# Patient Record
Sex: Female | Born: 1945 | Race: White | Hispanic: No | Smoking: Current every day smoker
Health system: Southern US, Community
[De-identification: ages and names within clinical notes are randomized; demographics above are authoritative.]

## PROBLEM LIST (undated history)

## (undated) DIAGNOSIS — F329 Major depressive disorder, single episode, unspecified: Secondary | ICD-10-CM

## (undated) DIAGNOSIS — G620 Drug-induced polyneuropathy: Secondary | ICD-10-CM

## (undated) DIAGNOSIS — C3490 Malignant neoplasm of unspecified part of unspecified bronchus or lung: Secondary | ICD-10-CM

## (undated) DIAGNOSIS — F17200 Nicotine dependence, unspecified, uncomplicated: Secondary | ICD-10-CM

## (undated) DIAGNOSIS — F32A Depression, unspecified: Secondary | ICD-10-CM

## (undated) DIAGNOSIS — M810 Age-related osteoporosis without current pathological fracture: Secondary | ICD-10-CM

## (undated) DIAGNOSIS — K224 Dyskinesia of esophagus: Secondary | ICD-10-CM

## (undated) DIAGNOSIS — T451X5A Adverse effect of antineoplastic and immunosuppressive drugs, initial encounter: Secondary | ICD-10-CM

## (undated) HISTORY — DX: Dyskinesia of esophagus: K22.4

## (undated) HISTORY — PX: HERNIA REPAIR: SHX51

## (undated) HISTORY — PX: CATARACT EXTRACTION, BILATERAL: SHX1313

## (undated) HISTORY — DX: Drug-induced polyneuropathy: G62.0

## (undated) HISTORY — DX: Depression, unspecified: F32.A

## (undated) HISTORY — PX: TOTAL ABDOMINAL HYSTERECTOMY: SHX209

## (undated) HISTORY — DX: Nicotine dependence, unspecified, uncomplicated: F17.200

## (undated) HISTORY — DX: Adverse effect of antineoplastic and immunosuppressive drugs, initial encounter: T45.1X5A

## (undated) HISTORY — DX: Age-related osteoporosis without current pathological fracture: M81.0

## (undated) HISTORY — DX: Major depressive disorder, single episode, unspecified: F32.9

---

## 2005-01-31 HISTORY — PX: LUNG REMOVAL, PARTIAL: SHX233

## 2012-05-13 ENCOUNTER — Emergency Department (HOSPITAL_COMMUNITY)
Admission: EM | Admit: 2012-05-13 | Discharge: 2012-05-13 | Disposition: A | Discharge status: Home / Self Care | Payer: Medicare Other | Attending: Emergency Medicine | Admitting: Emergency Medicine

## 2012-05-13 ENCOUNTER — Encounter (HOSPITAL_COMMUNITY): Payer: Self-pay | Admitting: *Deleted

## 2012-05-13 DIAGNOSIS — Z85118 Personal history of other malignant neoplasm of bronchus and lung: Secondary | ICD-10-CM | POA: Insufficient documentation

## 2012-05-13 DIAGNOSIS — F17200 Nicotine dependence, unspecified, uncomplicated: Secondary | ICD-10-CM | POA: Insufficient documentation

## 2012-05-13 DIAGNOSIS — Z7982 Long term (current) use of aspirin: Secondary | ICD-10-CM | POA: Insufficient documentation

## 2012-05-13 DIAGNOSIS — Z760 Encounter for issue of repeat prescription: Secondary | ICD-10-CM | POA: Insufficient documentation

## 2012-05-13 DIAGNOSIS — Z79899 Other long term (current) drug therapy: Secondary | ICD-10-CM | POA: Insufficient documentation

## 2012-05-13 HISTORY — DX: Malignant neoplasm of unspecified part of unspecified bronchus or lung: C34.90

## 2012-05-13 MED ORDER — TRAMADOL HCL 50 MG PO TABS
50.0000 mg | ORAL_TABLET | Freq: Once | ORAL | Status: AC
  Administered 2012-05-13: 50 mg via ORAL
  Filled 2012-05-13: qty 1

## 2012-05-13 NOTE — ED Notes (Signed)
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  States she has recently moved here and accidentally threw out her rx for Methadone.

## 2012-05-13 NOTE — ED Provider Notes (Signed)
History    This chart was scribed for non-physician practitioner working with Shelda Jakes, MD by Frederik Pear, ED Scribe. This patient was seen in room TR04C/TR04C and the patient's care was started at 2224.   CSN: [id number]  Arrival date & time 05/13/12  2047   First MD Initiated Contact with Patient 05/13/12 2224      Chief Complaint  Patient presents with  . Medication Refill    (Consider location/radiation/quality/duration/timing/severity/associated sxs/prior treatment) The history is provided by the patient and medical records. No language interpreter was used.    Krystal Stephens is a 66 y.o. female who presents to the Emergency Department with a chief complaint of a medication refill. She reports that she has a h/o of lung cancer and takes 10 mg of daily methadone by [facility]. She states that she threw accidentally threw away her prescription and is out of her medication. She states that she has been taking extra strength Excedrin without relief.   Past Medical History  Diagnosis Date  . Lung cancer     stage 4, 6 years ago, pt survived    History reviewed. No pertinent past surgical history.  History reviewed. No pertinent family history.  History  Substance Use Topics  . Smoking status: Current Every Day Smoker -- 0.10 packs/day  . Smokeless tobacco: Not on file  . Alcohol Use: Yes    OB History   Grav Para Term Preterm Abortions TAB SAB Ect Mult Living                  Review of Systems A complete 10 system review of systems was obtained and all systems are negative except as noted in the HPI and PMH.   Allergies  Anesthetics, amide  Home Medications   Current Outpatient Rx  Name  Route  Sig  Dispense  Refill  . aspirin 81 MG tablet   Oral   Take 81 mg by mouth daily.         Marland Kitchen buPROPion (WELLBUTRIN SR) 150 MG 12 hr tablet   Oral   Take 150 mg by mouth 2 (two) times daily.         . cholecalciferol (VITAMIN D) 1000 UNITS tablet   Oral   Take 3,000 Units by mouth daily.         Marland Kitchen gabapentin (NEURONTIN) 800 MG tablet   Oral   Take 1,600 mg by mouth 2 (two) times daily.         Marland Kitchen loratadine (CLARITIN) 10 MG tablet   Oral   Take 10 mg by mouth daily.         . methadone (DOLOPHINE) 10 MG tablet   Oral   Take 10 mg by mouth 4 (four) times daily.         . Multiple Vitamin (MULTIVITAMIN WITH MINERALS) TABS   Oral   Take 1 tablet by mouth daily.           BP 163/102  Pulse 80  Temp(Src) 98.1 F (36.7 C) (Oral)  Resp 18  SpO2 100%  Physical Exam  Nursing note and vitals reviewed. Constitutional: She is oriented to person, place, and time. She appears well-developed and well-nourished. No distress.  HENT:  Head: Normocephalic and atraumatic.  Eyes: EOM are normal. Pupils are equal, round, and reactive to light.  Neck: Normal range of motion. Neck supple. No tracheal deviation present.  Cardiovascular: Normal rate.   Pulmonary/Chest: Effort normal. No respiratory distress.  Abdominal: Soft. She exhibits no distension.  Musculoskeletal: Normal range of motion. She exhibits no edema.  Neurological: She is alert and oriented to person, place, and time.  Skin: Skin is warm and dry.  Psychiatric: She has a normal mood and affect. Her behavior is normal.    ED Course  Procedures (including critical care time)  DIAGNOSTIC STUDIES: Oxygen Saturation is 100% on room air, normal by my interpretation.    COORDINATION OF CARE:  22:38- Discussed planned course of treatment with the patient, including Tramadol, who is agreeable at this time.  Labs Reviewed - No data to display No results found.   1. Medication refill       MDM  Patient "threw away her methadone rx."  Informed her of our pain med refill policy.  Patient drove herself her.  Will give her some tramadol, and will have her f/u with her pain clinic tomorrow.  I personally performed the services described in this documentation,  which was scribed in my presence. The recorded information has been reviewed and is accurate.         Roxy Horseman, PA-C 05/14/12 [phone]

## 2012-05-13 NOTE — ED Notes (Signed)
Pt reports she accidentally threw out her prescription for methadone and c/o bilateral hand and foot pain. Pt reports she started taking methadone last year for neuropathy, takes it 4 times a day. Pt reports last dose was this morning and has been supplementing with extra strength Excedrin and hasn't gotten any relief. Pt reports she was given the prescription by [facility]. Pt in nad, skin warm and dry, resp e/u.

## 2012-05-15 NOTE — ED Provider Notes (Signed)
Medical screening examination/treatment/procedure(s) were performed by non-physician practitioner and as supervising physician I was immediately available for consultation/collaboration.    Shakim Faith W. Samina Weekes, MD 05/15/12 1325 

## 2013-03-04 ENCOUNTER — Other Ambulatory Visit: Payer: Self-pay | Admitting: Family Medicine

## 2013-03-04 DIAGNOSIS — R1310 Dysphagia, unspecified: Secondary | ICD-10-CM

## 2013-03-08 ENCOUNTER — Ambulatory Visit
Admission: RE | Admit: 2013-03-08 | Discharge: 2013-03-08 | Disposition: A | Discharge status: Home / Self Care | Payer: Medicare Other | Source: Ambulatory Visit | Attending: Family Medicine | Admitting: Family Medicine

## 2013-03-08 ENCOUNTER — Other Ambulatory Visit: Payer: Self-pay | Admitting: Family Medicine

## 2013-03-08 DIAGNOSIS — R1310 Dysphagia, unspecified: Secondary | ICD-10-CM

## 2013-04-09 ENCOUNTER — Ambulatory Visit: Payer: Medicare Other | Attending: Family Medicine

## 2013-04-10 ENCOUNTER — Other Ambulatory Visit (HOSPITAL_COMMUNITY): Payer: Self-pay | Admitting: Family Medicine

## 2013-04-10 DIAGNOSIS — R1310 Dysphagia, unspecified: Secondary | ICD-10-CM

## 2013-04-16 ENCOUNTER — Ambulatory Visit (HOSPITAL_COMMUNITY): Admission: RE | Admit: 2013-04-16 | Payer: Medicare Other | Source: Ambulatory Visit

## 2013-04-16 ENCOUNTER — Inpatient Hospital Stay (HOSPITAL_COMMUNITY): Admission: RE | Admit: 2013-04-16 | Payer: Medicare Other | Source: Ambulatory Visit

## 2013-11-04 ENCOUNTER — Ambulatory Visit (INDEPENDENT_AMBULATORY_CARE_PROVIDER_SITE_OTHER): Payer: Medicare Other | Admitting: Cardiology

## 2013-11-04 ENCOUNTER — Encounter: Payer: Self-pay | Admitting: Cardiology

## 2013-11-04 VITALS — BP 126/68 | HR 67 | Ht 64.0 in | Wt 134.6 lb

## 2013-11-04 DIAGNOSIS — G620 Drug-induced polyneuropathy: Secondary | ICD-10-CM | POA: Insufficient documentation

## 2013-11-04 DIAGNOSIS — F329 Major depressive disorder, single episode, unspecified: Secondary | ICD-10-CM | POA: Insufficient documentation

## 2013-11-04 DIAGNOSIS — F32A Depression, unspecified: Secondary | ICD-10-CM | POA: Insufficient documentation

## 2013-11-04 DIAGNOSIS — C3490 Malignant neoplasm of unspecified part of unspecified bronchus or lung: Secondary | ICD-10-CM | POA: Insufficient documentation

## 2013-11-04 DIAGNOSIS — T451X5A Adverse effect of antineoplastic and immunosuppressive drugs, initial encounter: Secondary | ICD-10-CM

## 2013-11-04 DIAGNOSIS — R079 Chest pain, unspecified: Secondary | ICD-10-CM | POA: Insufficient documentation

## 2013-11-04 DIAGNOSIS — Z720 Tobacco use: Secondary | ICD-10-CM

## 2013-11-04 DIAGNOSIS — F17200 Nicotine dependence, unspecified, uncomplicated: Secondary | ICD-10-CM | POA: Insufficient documentation

## 2013-11-04 DIAGNOSIS — M810 Age-related osteoporosis without current pathological fracture: Secondary | ICD-10-CM | POA: Insufficient documentation

## 2013-11-04 DIAGNOSIS — K224 Dyskinesia of esophagus: Secondary | ICD-10-CM | POA: Insufficient documentation

## 2013-11-04 NOTE — Patient Instructions (Signed)
Your physician has requested that you have an echocardiogram. Echocardiography is a painless test that uses sound waves to create images of your heart. It provides your doctor with information about the size and shape of your heart and how well your heart's chambers and valves are working. This procedure takes approximately one hour. There are no restrictions for this procedure.  Your physician has requested that you have a lexiscan myoview. For further information please visit [email or web address]. Please follow instruction sheet, as given.  Your physician recommends that you schedule a follow-up appointment as needed.

## 2013-11-04 NOTE — Progress Notes (Signed)
[place], [address] [place], [place]  [address] Phone: [phone] Fax:  [phone]  Date:  11/04/2013   ID:  Gertha Lichtenberg, DOB February 22, 1945, MRN [id number]  PCP:  Tawanna Solo, MD  Cardiologist:  Fransico Him, MD    History of Present Illness: Krystal Stephens is a 68 y.o. female with a history of stage 4 lung CA s/p right lobectomy 2007 and chemo with resultant neuropathy and then left lung CA in 2013 with repeat chemo, depression and tobacco use who presents today for evaluation of chest pain.  She says that the chest pain has been occurring for about 4 months and has had it 4 times.  She has a very strong family history of CAD.  She has awakened twice with severe chest burning mid sternal and spread across her chest.  She also noticed that her heart beat was very erratic.  She also had some pain in her left arm.  She took an ASA and it settled down.  It was different than her discomfort she gets with her esophageal dysmotility.  The other 2 episodes occurred during the day while doing housework.  These episodes lasted about 15-20 minutes and resolved when she laid down.  She had similar symptoms with left arm pain and chest burning.  She did get diaphoretic and nauseated.  She occasionally has some DOE.  She denies PND or orthopnea. She denies any LE edema or syncope.     Wt Readings from Last 3 Encounters:  11/04/13 134 lb 9.6 oz (61.054 kg)     Past Medical History  Diagnosis Date  . Lung cancer     stage 4 with right lobectomy 2007 s/p chemo and then found in left lung 2013 s/p chemo  . Peripheral neuropathy due to chemotherapy   . Depression   . Tobacco use disorder   . Osteoporosis   . Esophageal dysmotility     Current Outpatient Prescriptions  Medication Sig Dispense Refill  . alendronate (FOSAMAX) 70 MG tablet Take 70 mg by mouth once a week. Take with a full glass of water on an empty stomach.      Marland Kitchen aspirin 81 MG tablet Take 81 mg by mouth daily.      .  Calcium-Magnesium-Vitamin D (CALCIUM 500 PO) Take by mouth daily.      . cholecalciferol (VITAMIN D) 1000 UNITS tablet Take 3,000 Units by mouth daily.      Marland Kitchen escitalopram (LEXAPRO) 5 mg TABS tablet Take 5 mg by mouth 2 (two) times daily.      Marland Kitchen gabapentin (NEURONTIN) 800 MG tablet Take 800 mg by mouth 3 (three) times daily.       Marland Kitchen HYDROMORPHONE HCL PO Take by mouth as directed.      . loratadine (CLARITIN) 10 MG tablet Take 10 mg by mouth daily.      . methadone (DOLOPHINE) 10 MG tablet Take 10 mg by mouth 4 (four) times daily.      . Multiple Vitamin (MULTIVITAMIN WITH MINERALS) TABS Take 1 tablet by mouth daily.      . SUMAtriptan Succinate (IMITREX PO) Take 100 mg by mouth as needed. FOR MIGRAINES       No current facility-administered medications for this visit.    Allergies:    Allergies  Allergen Reactions  . Anesthetics, Amide     Unable to say what type of anesthetic  . Darvon [Propoxyphene]   . Demerol [Meperidine]   . Pentothal [Thiopental]   .  Shellfish Allergy     Social History:  The patient  reports that she has been smoking.  She does not have any smokeless tobacco history on file. She reports that she drinks alcohol. She reports that she does not use illicit drugs.   Family History:  The patient's family history includes Heart attack in her mother; Heart disease in her brother, father, and mother; Heart failure in her father and mother; Hypertension in her father and mother.   ROS:  Please see the history of present illness.      All other systems reviewed and negative.   PHYSICAL EXAM: VS:  BP 126/68  Pulse 67  Ht 5\' 4"  (1.626 m)  Wt 134 lb 9.6 oz (61.054 kg)  BMI 23.09 kg/m2 Well nourished, well developed, in no acute distress HEENT: normal Neck: no JVD Cardiac:  normal S1, S2; RRR; no murmur Lungs:  clear to auscultation bilaterally, no wheezing, rhonchi or rales Abd: soft, nontender, no hepatomegaly Ext: no edema Skin: warm and dry Neuro:  CNs 2-12  intact, no focal abnormalities noted  EKG:  NSR with no ST changes     ASSESSMENT AND PLAN:  1. Chest pain with normal EKG in a patient with significant cardiac risk factors including strong family history of CAD, history of XRT to chest, ongoing tobacco use. Leane Call (she has a peripheral neuropathy and cannot walk on treadmill) - check 2D echo to evaluate for pericardial effusion 2. Ongoing tobacco use 3. Stage IV lung CA in both lung 4. Chemo induced peripheral neuropathy  Followup PRN pending results of stress test  Signed, Fransico Him, MD [facility] 11/04/2013 9:33 AM

## 2013-11-12 ENCOUNTER — Ambulatory Visit (HOSPITAL_BASED_OUTPATIENT_CLINIC_OR_DEPARTMENT_OTHER): Payer: Medicare Other | Admitting: Radiology

## 2013-11-12 ENCOUNTER — Ambulatory Visit (HOSPITAL_COMMUNITY): Payer: Medicare Other | Attending: Internal Medicine | Admitting: Radiology

## 2013-11-12 VITALS — BP 108/73 | Ht 64.0 in | Wt 131.0 lb

## 2013-11-12 DIAGNOSIS — R079 Chest pain, unspecified: Secondary | ICD-10-CM | POA: Insufficient documentation

## 2013-11-12 DIAGNOSIS — R61 Generalized hyperhidrosis: Secondary | ICD-10-CM | POA: Diagnosis not present

## 2013-11-12 DIAGNOSIS — R002 Palpitations: Secondary | ICD-10-CM | POA: Insufficient documentation

## 2013-11-12 DIAGNOSIS — R0600 Dyspnea, unspecified: Secondary | ICD-10-CM | POA: Diagnosis not present

## 2013-11-12 DIAGNOSIS — R0602 Shortness of breath: Secondary | ICD-10-CM

## 2013-11-12 DIAGNOSIS — F17200 Nicotine dependence, unspecified, uncomplicated: Secondary | ICD-10-CM

## 2013-11-12 DIAGNOSIS — R110 Nausea: Secondary | ICD-10-CM | POA: Insufficient documentation

## 2013-11-12 MED ORDER — TECHNETIUM TC 99M SESTAMIBI GENERIC - CARDIOLITE
33.0000 | Freq: Once | INTRAVENOUS | Status: AC | PRN
  Administered 2013-11-12: 33 via INTRAVENOUS

## 2013-11-12 MED ORDER — REGADENOSON 0.4 MG/5ML IV SOLN
0.4000 mg | Freq: Once | INTRAVENOUS | Status: AC
  Administered 2013-11-12: 0.4 mg via INTRAVENOUS

## 2013-11-12 MED ORDER — AMINOPHYLLINE 25 MG/ML IV SOLN
75.0000 mg | Freq: Once | INTRAVENOUS | Status: AC
  Administered 2013-11-12: 75 mg via INTRAVENOUS

## 2013-11-12 MED ORDER — TECHNETIUM TC 99M SESTAMIBI GENERIC - CARDIOLITE
11.0000 | Freq: Once | INTRAVENOUS | Status: AC | PRN
  Administered 2013-11-12: 11 via INTRAVENOUS

## 2013-11-12 NOTE — Progress Notes (Signed)
Echocardiogram performed.  

## 2013-11-12 NOTE — Progress Notes (Signed)
[facility] [address] [place], [place] [address] [phone]    Cardiology Nuclear Med Study  Krystal Stephens is a 67 y.o. female     MRN : [id number]     DOB: 19-Mar-1945  Procedure Date: 11/12/2013  Nuclear Med Background Indication for Stress Test:  Evaluation for Ischemia History:  Asthma and Stage 4 Lung CA, '13 Chemo '07  (R) Lobectomy Cardiac Risk Factors: none  Symptoms:  Chest Pain, Diaphoresis, DOE, Nausea and Palpitations   Nuclear Pre-Procedure Caffeine/Decaff Intake:  10:00pm NPO After: 10:00pm   Lungs:  clear O2 Sat: 95/96% on room air. IV 0.9% NS with Angio Cath:  22g  IV Site: R Hand  IV Started by:  Matilde Haymaker, RN  Chest Size (in):  34 Cup Size: B  Height: 5\' 4"  (1.626 m)  Weight:  131 lb (59.421 kg)  BMI:  Body mass index is 22.47 kg/(m^2). Tech Comments:  Aminophylline 75 mg IV given for symptoms. All were resolved before leaving.    Nuclear Med Study 1 or 2 day study: 1 day  Stress Test Type:  Lexiscan  Reading MD: n/a  Order Authorizing Provider:  Tressia Miners Turner,MD  Resting Radionuclide: Technetium 78m Sestamibi  Resting Radionuclide Dose: 11.0 mCi   Stress Radionuclide:  Technetium 98m Sestamibi  Stress Radionuclide Dose: 33.0 mCi           Stress Protocol Rest HR: 53 Stress HR: 71  Rest BP: 108/73 Stress BP: 120/86  Exercise Time (min): n/a METS: n/a   Predicted Max HR: 152 bpm % Max HR: 46.71 bpm Rate Pressure Product: 8520   Dose of Adenosine (mg):  n/a Dose of Lexiscan: 0.4 mg  Dose of Atropine (mg): n/a Dose of Dobutamine: n/a mcg/kg/min (at max HR)  Stress Test Technologist: Perrin Maltese, EMT-P  Nuclear Technologist:  Earl Many, CNMT     Rest Procedure:  Myocardial perfusion imaging was performed at rest 45 minutes following the intravenous administration of Technetium 29m Sestamibi. Rest ECG: Sinus bradycardia; no ST changes.  Stress Procedure:  The patient received IV Lexiscan 0.4 mg  over 15-seconds.  Technetium 62m Sestamibi injected at 30-seconds. This patient felt weird and had nausea with the Lexiscan injection. Quantitative spect images were obtained after a 45 minute delay. Stress ECG: No significant ST segment change suggestive of ischemia.  QPS Raw Data Images:  Acquisition technically good; normal left ventricular size. Stress Images:  Normal homogeneous uptake in all areas of the myocardium. Rest Images:  Normal homogeneous uptake in all areas of the myocardium. Subtraction (SDS):  No evidence of ischemia. Transient Ischemic Dilatation (Normal <1.22):  0.96 Lung/Heart Ratio (Normal <0.45):  0.28  Quantitative Gated Spect Images QGS EDV:  63 ml QGS ESV:  13 ml  Impression Exercise Capacity:  Lexiscan with no exercise. BP Response:  Normal blood pressure response. Clinical Symptoms:  No chest pain or dyspnea. ECG Impression:  No significant ST segment change suggestive of ischemia. Comparison with Prior Nuclear Study: No previous nuclear study performed  Overall Impression:  Normal stress nuclear study.  LV Ejection Fraction: 79%.  LV Wall Motion:  NL LV Function; NL Wall Motion  Kirk Ruths

## 2013-11-18 ENCOUNTER — Other Ambulatory Visit: Payer: Self-pay | Admitting: *Deleted

## 2013-11-18 DIAGNOSIS — K219 Gastro-esophageal reflux disease without esophagitis: Secondary | ICD-10-CM

## 2013-11-18 MED ORDER — PANTOPRAZOLE SODIUM 40 MG PO TBEC
40.0000 mg | DELAYED_RELEASE_TABLET | Freq: Every day | ORAL | Status: AC
Start: 2013-11-18 — End: ?

## 2013-12-30 ENCOUNTER — Encounter: Payer: Self-pay | Admitting: *Deleted

## 2014-01-01 ENCOUNTER — Ambulatory Visit: Payer: Medicare Other | Admitting: Cardiology

## 2014-01-29 ENCOUNTER — Encounter: Payer: Self-pay | Admitting: Cardiology

## 2014-06-25 ENCOUNTER — Other Ambulatory Visit: Payer: Self-pay | Admitting: Family Medicine

## 2014-06-25 DIAGNOSIS — R51 Headache: Principal | ICD-10-CM

## 2014-06-25 DIAGNOSIS — R519 Headache, unspecified: Secondary | ICD-10-CM

## 2014-07-05 ENCOUNTER — Ambulatory Visit
Admission: RE | Admit: 2014-07-05 | Discharge: 2014-07-05 | Disposition: A | Discharge status: Home / Self Care | Payer: Medicare Other | Source: Ambulatory Visit | Attending: Family Medicine | Admitting: Family Medicine

## 2014-07-05 DIAGNOSIS — R51 Headache: Principal | ICD-10-CM

## 2014-07-05 DIAGNOSIS — R519 Headache, unspecified: Secondary | ICD-10-CM

## 2014-07-05 MED ORDER — GADOBENATE DIMEGLUMINE 529 MG/ML IV SOLN
13.0000 mL | Freq: Once | INTRAVENOUS | Status: AC | PRN
  Administered 2014-07-05: 13 mL via INTRAVENOUS

## 2015-10-14 IMAGING — RF DG UGI W/O KUB
15 of 18 series · 18 of 24 positions shown · non-contrast
Comparison: None.

FLUOROSCOPY TIME:  1 min 48 seconds

CLINICAL DATA: Dysphagia, history of lung cancer

EXAM:
UPPER GI SERIES WITHOUT KUB
TECHNIQUE: Routine upper GI series was performed with thin barium.

[Series 1: run · 2 of 15 slices shown (1 of 14)]
[im 1/15]
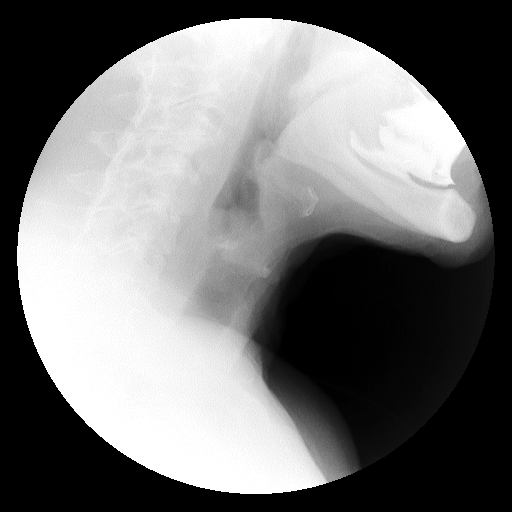
[im 15/15]
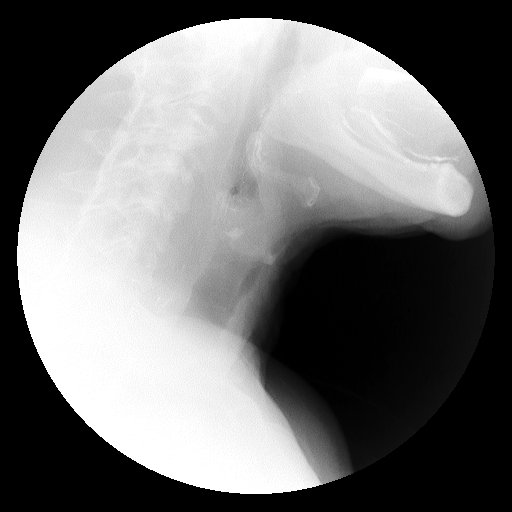

[Series 2: run · 3 of 13 slices shown (2 of 14)]
[im 1/13]
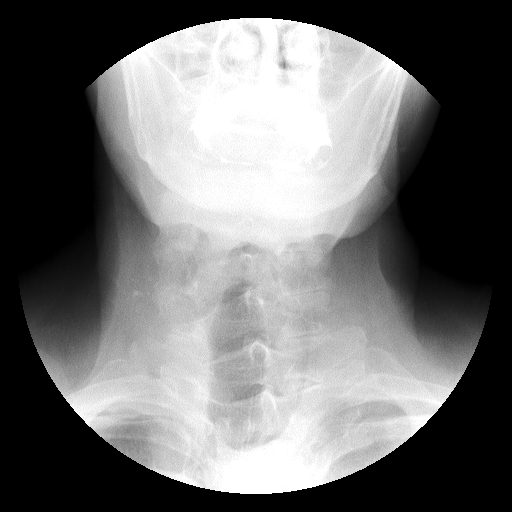
[im 5/13]
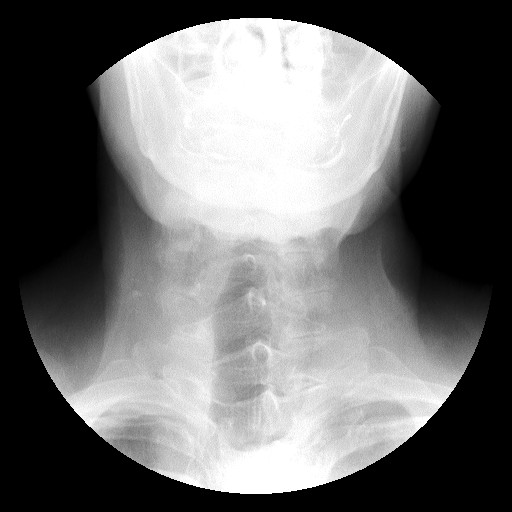
[im 13/13]
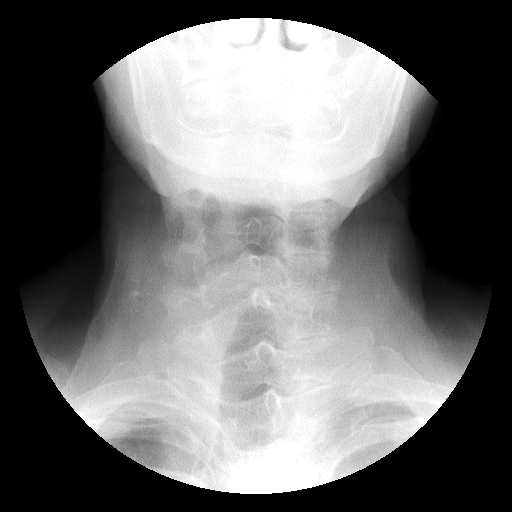

[Series 3: run · 1 of 4 slices shown (3 of 14)]
[im 1/4]
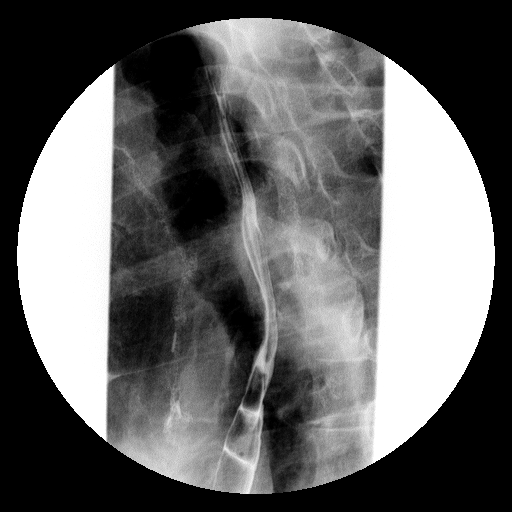

[Series 4: run · 1 of 2 slices shown (4 of 14)]
[im 1/2]
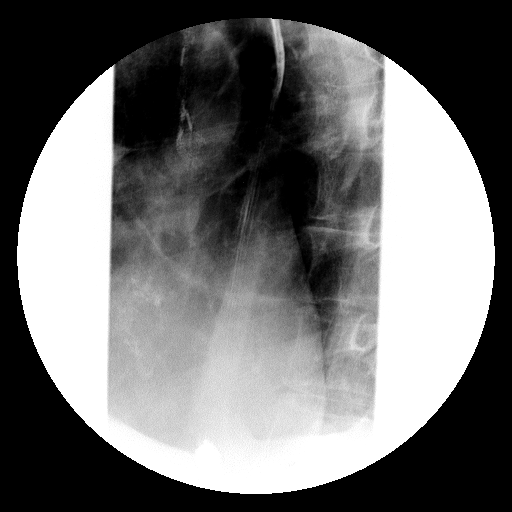

[Series 6: run · 1 of 3 slices shown (5 of 14)]
[im 1/3]
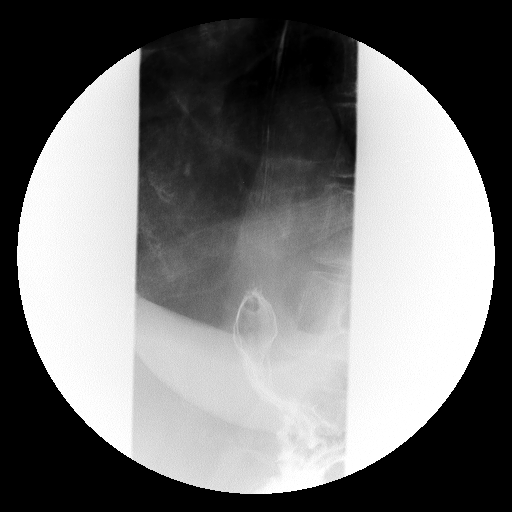

[Series 7: run · 1 of 1 slices shown (6 of 14)]
[im 1/1]
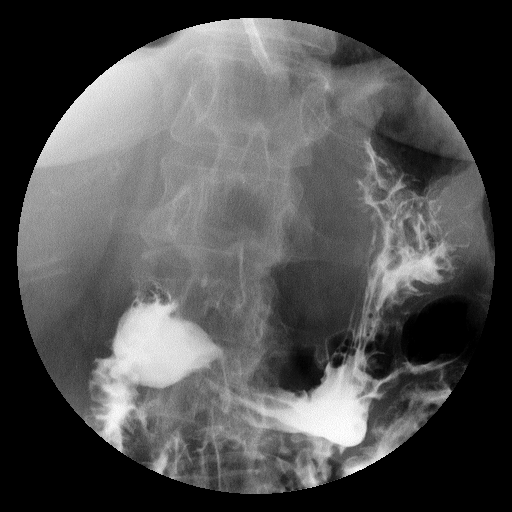

[Series 8: run · 1 of 2 slices shown (7 of 14)]
[im 1/2]
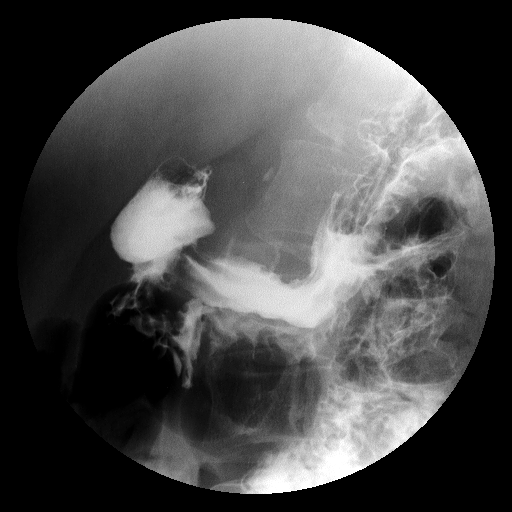

[Series 10: run · 1 of 1 slices shown (8 of 14)]
[im 1/1]
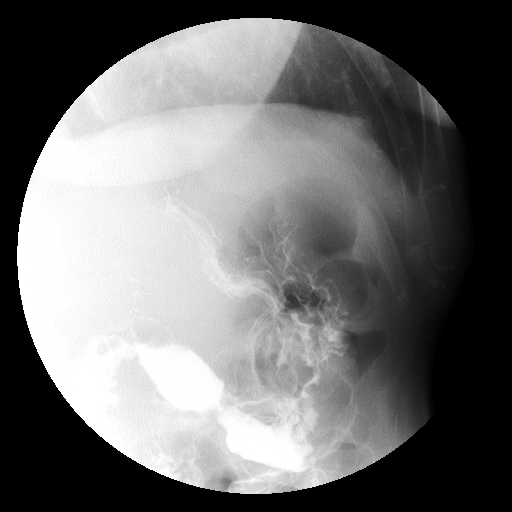

[Series 11: run · 1 of 1 slices shown (9 of 14)]
[im 1/1]
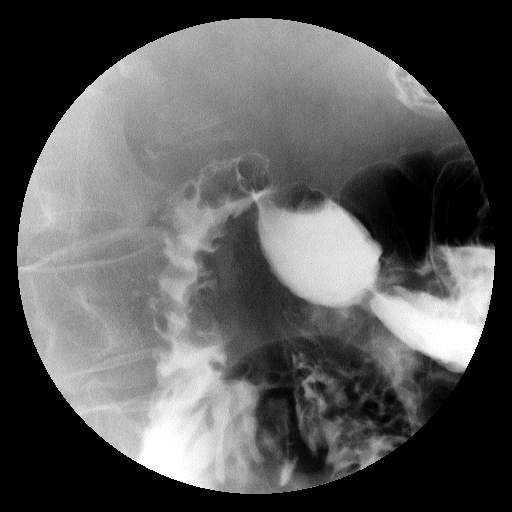

[Series 12: run · 1 of 8 slices shown (10 of 14)]
[im 1/8]
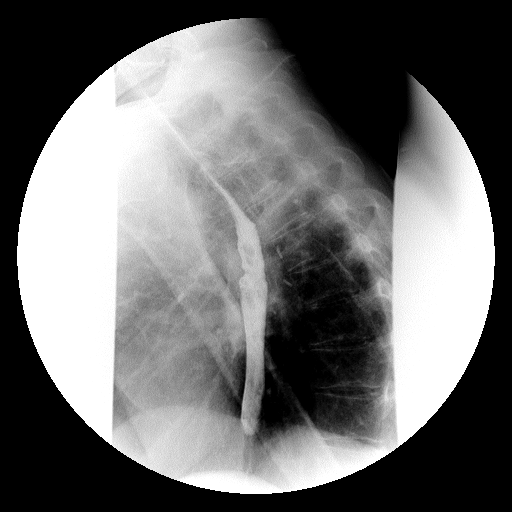

[Series 13: run · 1 of 5 slices shown (11 of 14)]
[im 1/5]
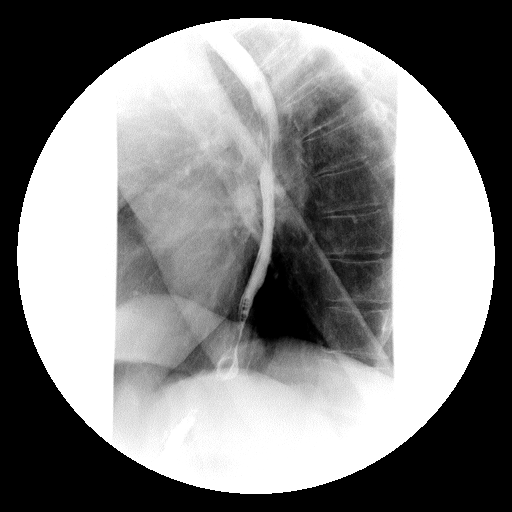

[Series 14: run · 1 of 4 slices shown (12 of 14)]
[im 1/4]
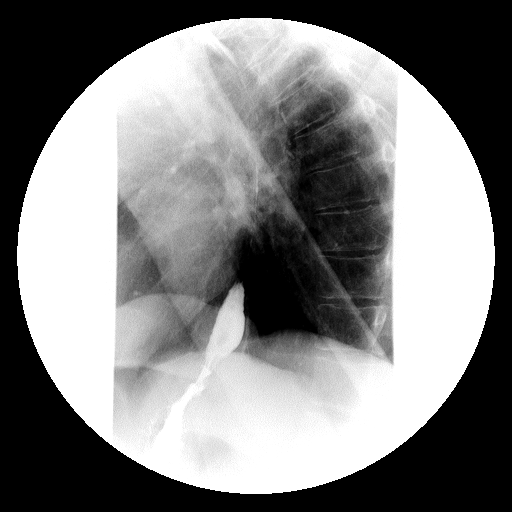

[Series 15: run · 1 of 1 slices shown (13 of 14)]
[im 1/1]
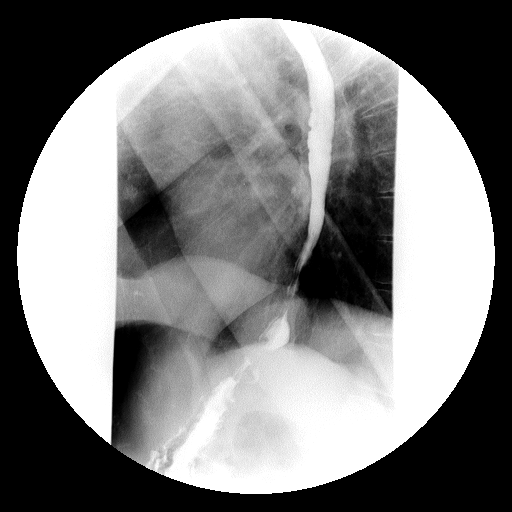

[Series 17: run · 1 of 1 slices shown (14 of 14)]
[im 1/1]
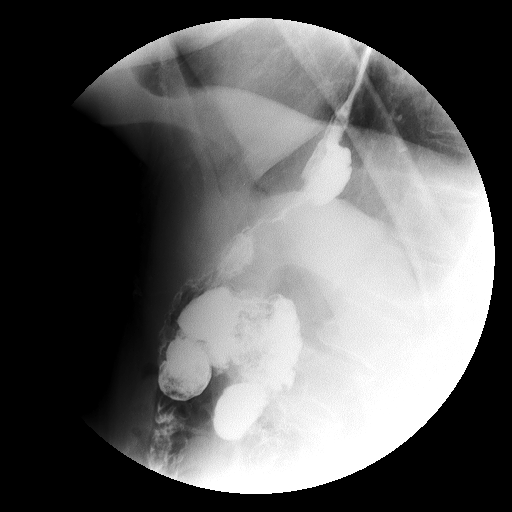

[Series 1001: view not recorded · 0.20mm/px · 1 of 1 slices shown]
[im 1/1]
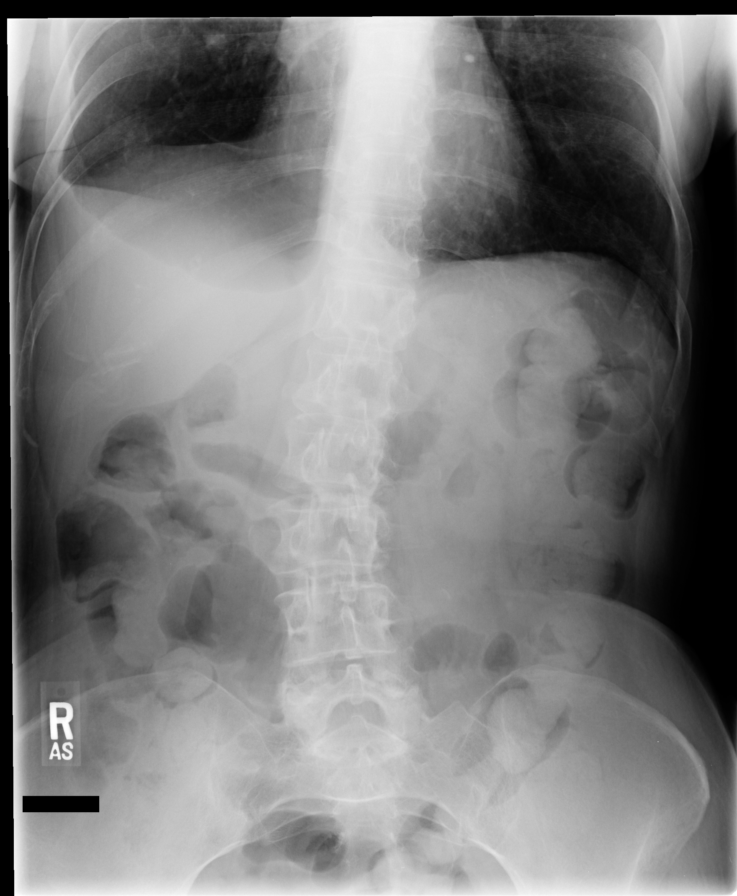

[18 of 24 positions shown; findings below may reference images not displayed]

FINDINGS: Patient could not tolerate a full double-contrast evaluation. As
such, a single contrast upper GI was performed.

Normal oral phase of swallowing. No laryngeal penetration or
tracheobronchial aspiration. However, the patient did appear to have
difficulty clearing secretions subsequent to the initial swallow.

Mild esophageal dysmotility involving the lower third of esophagus.
No fixed esophageal narrowing or stricture.

Small hiatal hernia. Gastroesophageal reflux could not be assessed
to the patient's inability to clear her esophagus in the prone
position.

Gastric folds are grossly unremarkable.

Duodenal bulb is within normal limits.
IMPRESSION: No laryngeal penetration or tracheobronchial aspiration.

However, patient demonstrated difficulty clearing secretions
subsequent to the initial swallow. Consider dedicated speech
pathology evaluation.

Mild lower esophageal dysmotility.

Small hiatal hernia.

## 2018-06-01 DEATH — deceased
